# Patient Record
Sex: Female | Born: 1955 | Race: White | Hispanic: No | Smoking: Never smoker
Health system: Southern US, Community
[De-identification: ages and names within clinical notes are randomized; demographics above are authoritative.]

## PROBLEM LIST (undated history)

## (undated) DIAGNOSIS — E119 Type 2 diabetes mellitus without complications: Secondary | ICD-10-CM

---

## 2016-05-24 ENCOUNTER — Emergency Department (HOSPITAL_BASED_OUTPATIENT_CLINIC_OR_DEPARTMENT_OTHER)
Admission: EM | Admit: 2016-05-24 | Discharge: 2016-05-24 | Disposition: A | Discharge status: Home / Self Care | Payer: BLUE CROSS/BLUE SHIELD | Attending: Emergency Medicine | Admitting: Emergency Medicine

## 2016-05-24 ENCOUNTER — Encounter (HOSPITAL_BASED_OUTPATIENT_CLINIC_OR_DEPARTMENT_OTHER): Payer: Self-pay

## 2016-05-24 ENCOUNTER — Emergency Department (HOSPITAL_BASED_OUTPATIENT_CLINIC_OR_DEPARTMENT_OTHER): Payer: BLUE CROSS/BLUE SHIELD

## 2016-05-24 DIAGNOSIS — Z7984 Long term (current) use of oral hypoglycemic drugs: Secondary | ICD-10-CM | POA: Insufficient documentation

## 2016-05-24 DIAGNOSIS — Z794 Long term (current) use of insulin: Secondary | ICD-10-CM | POA: Diagnosis not present

## 2016-05-24 DIAGNOSIS — M79605 Pain in left leg: Secondary | ICD-10-CM | POA: Diagnosis not present

## 2016-05-24 DIAGNOSIS — E119 Type 2 diabetes mellitus without complications: Secondary | ICD-10-CM | POA: Diagnosis not present

## 2016-05-24 HISTORY — DX: Type 2 diabetes mellitus without complications: E11.9

## 2016-05-24 LAB — CBC WITH DIFFERENTIAL/PLATELET
BASOS ABS: 0 10*3/uL (ref 0.0–0.1)
Basophils Relative: 1 %
Eosinophils Absolute: 0.3 10*3/uL (ref 0.0–0.7)
Eosinophils Relative: 4 %
HEMATOCRIT: 37.8 % (ref 36.0–46.0)
HEMOGLOBIN: 12.8 g/dL (ref 12.0–15.0)
LYMPHS PCT: 35 %
Lymphs Abs: 2.5 10*3/uL (ref 0.7–4.0)
MCH: 27.6 pg (ref 26.0–34.0)
MCHC: 33.9 g/dL (ref 30.0–36.0)
MCV: 81.5 fL (ref 78.0–100.0)
MONO ABS: 0.5 10*3/uL (ref 0.1–1.0)
MONOS PCT: 7 %
NEUTROS ABS: 3.9 10*3/uL (ref 1.7–7.7)
NEUTROS PCT: 53 %
Platelets: 241 10*3/uL (ref 150–400)
RBC: 4.64 MIL/uL (ref 3.87–5.11)
RDW: 14 % (ref 11.5–15.5)
WBC: 7.2 10*3/uL (ref 4.0–10.5)

## 2016-05-24 LAB — BASIC METABOLIC PANEL
ANION GAP: 6 (ref 5–15)
BUN: 14 mg/dL (ref 6–20)
CO2: 31 mmol/L (ref 22–32)
Calcium: 9 mg/dL (ref 8.9–10.3)
Chloride: 101 mmol/L (ref 101–111)
Creatinine, Ser: 0.67 mg/dL (ref 0.44–1.00)
GFR calc non Af Amer: >60 mL/min (ref >60)
GLUCOSE: 166 mg/dL — AB (ref 65–99)
POTASSIUM: 4.1 mmol/L (ref 3.5–5.1)
Sodium: 138 mmol/L (ref 135–145)

## 2016-05-24 LAB — MAGNESIUM: Magnesium: 2.1 mg/dL (ref 1.7–2.4)

## 2016-05-24 MED ORDER — DIAZEPAM 5 MG PO TABS: 5.0000 mg | ORAL_TABLET | Freq: Four times a day (QID) | ORAL | 0 refills | Status: AC | PRN

## 2016-05-24 MED ORDER — KETOROLAC TROMETHAMINE 30 MG/ML IJ SOLN
30.0000 mg | Freq: Once | INTRAMUSCULAR | Status: AC
  Administered 2016-05-24: 30 mg via INTRAMUSCULAR
  Filled 2016-05-24: qty 1

## 2016-05-24 MED ORDER — NAPROXEN 500 MG PO TABS: 500.0000 mg | ORAL_TABLET | Freq: Two times a day (BID) | ORAL | 0 refills | Status: AC

## 2016-05-24 MED ORDER — DIAZEPAM 5 MG/ML IJ SOLN
5.0000 mg | Freq: Once | INTRAMUSCULAR | Status: AC
  Administered 2016-05-24: 5 mg via INTRAMUSCULAR
  Filled 2016-05-24: qty 2

## 2016-05-24 NOTE — ED Triage Notes (Signed)
C/o muscle spasms/pain to left leg-started last night-denies injury-NAD-slow limping gait

## 2016-05-24 NOTE — ED Provider Notes (Signed)
[facility] Provider Note   CSN: [id number] Arrival date & time: 05/24/16  1808  By signing my name below, I, Alicia Haas, attest that this documentation has been prepared under the direction and in the presence of Pricilla Loveless, MD. Electronically Signed: Gillis Ends. Lyn Hollingshead, ED Scribe. 05/24/16. 6:59 PM.  History   Chief Complaint Chief Complaint  Patient presents with  . Leg Pain    The history is provided by the patient. No language interpreter was used.   HPI Comments: Alicia Haas is a 60 y.o. female with PMHx of DM who presents to the Emergency Department complaining of sudden onset, severe, muscle spasms in lower left extremity in thigh region radiating to the posterior leg which started 1 day ago. She states pain resembles a "charlie horse." Denies any recent injury to the area. She states that pain has not relieved with applying heat. Pain is exacerbated with applying pressure to the area and bearing weight. Denies any hx of DVT. Denies any abdominal pain, chest pain, weakness, numbness, or shortness of breath.  Past Medical History:  Diagnosis Date  . Diabetes mellitus without complication (HCC)     There are no active problems to display for this patient.   Past Surgical History:  Procedure Laterality Date  . CESAREAN SECTION      OB History    No data available       Home Medications    Prior to Admission medications   Medication Sig Start Date End Date Taking? Authorizing Provider  GABAPENTIN PO Take by mouth.   Yes Historical Provider, MD  insulin glargine (LANTUS) 100 UNIT/ML injection    Yes Historical Provider, MD  METFORMIN HCL PO Take by mouth.   Yes Historical Provider, MD  diazepam (VALIUM) 5 MG tablet Take 1 tablet (5 mg total) by mouth every 6 (six) hours as needed for muscle spasms (spasms). 05/24/16   Pricilla Loveless, MD  naproxen (NAPROSYN) 500 MG tablet Take 1 tablet (500 mg total) by mouth 2 (two) times daily with a meal.  05/24/16   Pricilla Loveless, MD    Family History No family history on file.  Social History Social History  Substance Use Topics  . Smoking status: Never Smoker  . Smokeless tobacco: Never Used  . Alcohol use No     Allergies   Review of patient's allergies indicates no known allergies.   Review of Systems Review of Systems  Constitutional: Negative for fever.  Respiratory: Negative for shortness of breath.   Cardiovascular: Negative for chest pain and leg swelling.  Gastrointestinal: Negative for abdominal pain.  Musculoskeletal: Positive for myalgias.  Neurological: Negative for weakness and numbness.  All other systems reviewed and are negative.  Physical Exam Updated Vital Signs BP 154/70 (BP Location: Right Arm)   Pulse 68   Temp 98.1 F (36.7 C) (Oral)   Resp 16   Ht 5\' 2"  (1.575 m)   Wt 172 lb (78 kg)   SpO2 98%   BMI 31.46 kg/m   Physical Exam  Constitutional: She is oriented to person, place, and time. She appears well-developed and well-nourished.  HENT:  Head: Normocephalic and atraumatic.  Right Ear: External ear normal.  Left Ear: External ear normal.  Nose: Nose normal.  Eyes: Right eye exhibits no discharge. Left eye exhibits no discharge.  Cardiovascular: Normal rate, regular rhythm and normal heart sounds.   2+ DP pulse left  Pulmonary/Chest: Effort normal and breath sounds normal.  Abdominal: Soft. There  is no tenderness.  Musculoskeletal:  Left lower extremity diffusely tender, worse in left lateral thigh and posterior calf. No obvious swelling or redness  Neurological: She is alert and oriented to person, place, and time.  Skin: Skin is warm and dry.  Nursing note and vitals reviewed.  ED Treatments / Results  DIAGNOSTIC STUDIES: Oxygen Saturation is 97% on RA, normal by my interpretation.    COORDINATION OF CARE: 6:43 PM-Discussed treatment plan which includes BMP, CBC Diff, Magnesium, order of Toradol and Valium, US Venous of Left  Leg with pt at bedside and pt agreed to plan.   Labs (all labs ordered are listed, but only abnormal results are displayed) Labs Reviewed  BASIC METABOLIC PANEL - Abnormal; Notable for the following:       Result Value   Glucose, Bld 166 (*)    All other components within normal limits  CBC WITH DIFFERENTIAL/PLATELET  MAGNESIUM   Radiology [place] Venous Img Lower Unilateral Left  Result Date: 05/24/2016 CLINICAL DATA:  Left thigh pain for 2 days EXAM: LEFT LOWER EXTREMITY VENOUS DUPLEX ULTRASOUND TECHNIQUE: Doppler venous assessment of the left lower extremity deep venous system was performed, including characterization of spectral flow, compressibility, and phasicity. COMPARISON:  None. FINDINGS: There is complete compressibility of the left common femoral, femoral, and popliteal veins. Doppler analysis demonstrates respiratory phasicity and augmentation of flow with calf compression. No obvious superficial vein or calf vein thrombosis. IMPRESSION: No evidence of left lower extremity DVT Electronically Signed   By: Jolaine ClickArthur  Hoss M.D.   On: 05/24/2016 20:42    Procedures Procedures (including critical care time)  Medications Ordered in ED Medications  ketorolac (TORADOL) 30 MG/ML injection 30 mg (30 mg Intramuscular Given 05/24/16 1908)  diazepam (VALIUM) injection 5 mg (5 mg Intramuscular Given 05/24/16 1908)   Initial Impression / Assessment and Plan / ED Course  I have reviewed the triage vital signs and the nursing notes.  Pertinent labs & imaging results that were available during my care of the patient were reviewed by me and considered in my medical decision making (see chart for details).  Clinical Course    Unclear exact etiology but this appears to be musculoskeletal. Likely spasm. Possible muscle strain from increased work. However there is no focal swelling, DVT ultrasound is negative, and has normal perfusion and strong DP pulse. Will treat with NSAIDs as well as Valium for  muscle relaxation. Follow-up with PCP if not improving. I highly doubt severe tear of the muscle or rhabdo my lysis.  Final Clinical Impressions(s) / ED Diagnoses   Final diagnoses:  Left leg pain    New Prescriptions Discharge Medication List as of 05/24/2016  8:55 PM    START taking these medications   Details  diazepam (VALIUM) 5 MG tablet Take 1 tablet (5 mg total) by mouth every 6 (six) hours as needed for muscle spasms (spasms)., Starting Wed 05/24/2016, Print    naproxen (NAPROSYN) 500 MG tablet Take 1 tablet (500 mg total) by mouth 2 (two) times daily with a meal., Starting Wed 05/24/2016, Print       I personally performed the services described in this documentation, which was scribed in my presence. The recorded information has been reviewed and is accurate.     Pricilla LovelessScott Tekoa Hamor, MD 05/24/16 2200

## 2016-05-24 NOTE — ED Notes (Signed)
Skin warm dry and intact. Denies injury. States it spasms and feels worse sitting and sometimes standing helps. +pulse.

## 2019-06-27 ENCOUNTER — Other Ambulatory Visit: Payer: Self-pay

## 2019-06-27 ENCOUNTER — Emergency Department (HOSPITAL_BASED_OUTPATIENT_CLINIC_OR_DEPARTMENT_OTHER): Payer: BLUE CROSS/BLUE SHIELD

## 2019-06-27 ENCOUNTER — Emergency Department (HOSPITAL_BASED_OUTPATIENT_CLINIC_OR_DEPARTMENT_OTHER)
Admission: EM | Admit: 2019-06-27 | Discharge: 2019-06-28 | Disposition: A | Discharge status: Home / Self Care | Payer: BLUE CROSS/BLUE SHIELD | Attending: Emergency Medicine | Admitting: Emergency Medicine

## 2019-06-27 ENCOUNTER — Encounter (HOSPITAL_BASED_OUTPATIENT_CLINIC_OR_DEPARTMENT_OTHER): Payer: Self-pay | Admitting: Adult Health

## 2019-06-27 DIAGNOSIS — E119 Type 2 diabetes mellitus without complications: Secondary | ICD-10-CM | POA: Diagnosis not present

## 2019-06-27 DIAGNOSIS — Z7984 Long term (current) use of oral hypoglycemic drugs: Secondary | ICD-10-CM | POA: Diagnosis not present

## 2019-06-27 DIAGNOSIS — M7918 Myalgia, other site: Secondary | ICD-10-CM | POA: Insufficient documentation

## 2019-06-27 DIAGNOSIS — Z79899 Other long term (current) drug therapy: Secondary | ICD-10-CM | POA: Insufficient documentation

## 2019-06-27 DIAGNOSIS — R519 Headache, unspecified: Secondary | ICD-10-CM | POA: Diagnosis present

## 2019-06-27 LAB — CBG MONITORING, ED: Glucose-Capillary: 341 mg/dL — ABNORMAL HIGH (ref 70–99)

## 2019-06-27 MED ORDER — CYCLOBENZAPRINE HCL 10 MG PO TABS: 10.0000 mg | ORAL_TABLET | Freq: Two times a day (BID) | ORAL | 0 refills | Status: AC | PRN

## 2019-06-27 MED ORDER — OXYCODONE-ACETAMINOPHEN 5-325 MG PO TABS
1.0000 | ORAL_TABLET | Freq: Once | ORAL | Status: AC
  Administered 2019-06-27: 1 via ORAL
  Filled 2019-06-27: qty 1
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
